# Patient Record
Sex: Female | Born: 1995 | Race: Black or African American | Hispanic: No | Marital: Single | Smoking: Never smoker
Health system: Southern US, Community
[De-identification: ages and names within clinical notes are randomized; demographics above are authoritative.]

## PROBLEM LIST (undated history)

## (undated) DIAGNOSIS — R7989 Other specified abnormal findings of blood chemistry: Secondary | ICD-10-CM

## (undated) DIAGNOSIS — N939 Abnormal uterine and vaginal bleeding, unspecified: Principal | ICD-10-CM

## (undated) DIAGNOSIS — F909 Attention-deficit hyperactivity disorder, unspecified type: Secondary | ICD-10-CM

## (undated) HISTORY — DX: Attention-deficit hyperactivity disorder, unspecified type: F90.9

## (undated) HISTORY — DX: Other specified abnormal findings of blood chemistry: R79.89

## (undated) HISTORY — DX: Abnormal uterine and vaginal bleeding, unspecified: N93.9

## (undated) HISTORY — PX: NO PAST SURGERIES: SHX2092

---
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
## 2015-02-19 ENCOUNTER — Ambulatory Visit: Payer: 59 | Admitting: Women's Health

## 2015-02-19 ENCOUNTER — Encounter: Payer: Self-pay | Admitting: Women's Health

## 2015-05-27 ENCOUNTER — Ambulatory Visit (INDEPENDENT_AMBULATORY_CARE_PROVIDER_SITE_OTHER): Payer: 59 | Admitting: Adult Health

## 2015-05-27 ENCOUNTER — Encounter: Payer: Self-pay | Admitting: Adult Health

## 2015-05-27 VITALS — BP 118/70 | HR 60 | Ht 67.5 in | Wt 158.0 lb

## 2015-05-27 DIAGNOSIS — N939 Abnormal uterine and vaginal bleeding, unspecified: Secondary | ICD-10-CM | POA: Diagnosis not present

## 2015-05-27 DIAGNOSIS — Z3202 Encounter for pregnancy test, result negative: Secondary | ICD-10-CM | POA: Diagnosis not present

## 2015-05-27 DIAGNOSIS — Z113 Encounter for screening for infections with a predominantly sexual mode of transmission: Secondary | ICD-10-CM

## 2015-05-27 HISTORY — DX: Abnormal uterine and vaginal bleeding, unspecified: N93.9

## 2015-05-27 LAB — POCT URINE PREGNANCY: Preg Test, Ur: NEGATIVE

## 2015-05-27 NOTE — Progress Notes (Signed)
Subjective:     Patient ID: Nancy Bradford, female   DOB: 1995-11-02, 19 y.o.   MRN: [id number]  HPI Nancy Bradford is a 19 year old black female in complaining of vaginal bleeding and wants STD screening,she is on lo loestrin but has missed some pills and started bleeding 10/13 after sex,still bleeding.No pain, wonders if "cervix ruptured with sex".  Review of Systems Patient denies any headaches, hearing loss, fatigue, blurred vision, shortness of breath, chest pain, abdominal pain, problems with bowel movements, urination, or intercourse. No joint pain or mood swings.See HPI for positives. Reviewed past medical,surgical, social and family history. Reviewed medications and allergies.     Objective:   Physical Exam BP 118/70 mmHg  Pulse 60  Ht 5' 7.5" (1.715 m)  Wt 158 lb (71.668 kg)  BMI 24.37 kg/m2  LMP 05/09/2015 UPT negative, Skin warm and dry.Pelvic: external genitalia is normal in appearance no lesions, vagina: period like blood,urethra has no lesions or masses noted, cervix:smooth, uterus: normal size, shape and contour, non tender, no masses felt, adnexa: no masses or tenderness noted. Bladder is non tender and no masses felt. GC/CHL obtained.    Explained cervix was fine, that bleeding is most likely from missed OCs.She can't find current pill pack, will give her a sample to day.  Assessment:     Vaginal bleeding STD screening    Plan:     Given 1 pack lo loestrin lot [phone] A exp 6/17,take every day at same time Use condoms Check HIV,RPR and HSV 2, GC/CHL sent   Follow up prn

## 2015-05-27 NOTE — Patient Instructions (Signed)
Take lo loestrin daily at same time Use condom Follow up prn

## 2015-05-28 LAB — GC/CHLAMYDIA PROBE AMP
Chlamydia trachomatis, NAA: NEGATIVE
Neisseria gonorrhoeae by PCR: NEGATIVE

## 2015-05-28 LAB — HSV 2 ANTIBODY, IGG: HSV 2 Glycoprotein G Ab, IgG: <0.91 index (ref 0.00–0.90)

## 2015-05-28 LAB — HIV ANTIBODY (ROUTINE TESTING W REFLEX): HIV Screen 4th Generation wRfx: NONREACTIVE

## 2015-05-28 LAB — RPR: RPR Ser Ql: NONREACTIVE

## 2015-12-28 ENCOUNTER — Other Ambulatory Visit: Payer: Self-pay | Admitting: Women's Health

## 2016-01-20 ENCOUNTER — Ambulatory Visit (INDEPENDENT_AMBULATORY_CARE_PROVIDER_SITE_OTHER): Payer: 59 | Admitting: Obstetrics and Gynecology

## 2016-01-20 ENCOUNTER — Encounter: Payer: Self-pay | Admitting: Obstetrics and Gynecology

## 2016-01-20 VITALS — BP 120/64 | Ht 67.5 in | Wt 164.0 lb

## 2016-01-20 DIAGNOSIS — Z113 Encounter for screening for infections with a predominantly sexual mode of transmission: Secondary | ICD-10-CM

## 2016-01-20 LAB — POCT WET PREP (WET MOUNT): Trichomonas Wet Prep HPF POC: NEGATIVE

## 2016-01-20 NOTE — Progress Notes (Signed)
Patient ID: Nancy Bradford, female   DOB: 1996-03-30, 20 y.o.   MRN: [id number]    [facility]  @[facility] @            Patient name: Nancy Bradford MRN [id number]  Date of birth: 1996-03-30  CC & HPI:  Nancy Bradford is a 20 y.o. female presenting today for STD screening. No symptoms currently. Pt is sexually active. She takes birth control pills and notes she occasionally forgets to take the pill; she also uses condoms intermittently. Pt reports having 1 partner in the past 6 months. LNMP 01/05/2016. Denies vaginal discharge, itching or pain, dysuria, fever, chills, abdominal pain, or flank pain.  ROS:  Review of Systems  Constitutional: Negative for fever.  Genitourinary: Negative for dysuria.       - vaginal itching, D/C, or pain  All other systems reviewed and are negative.  Pertinent History Reviewed:   Reviewed: Significant for none Medical         Past Medical History  Diagnosis Date  . ADHD (attention deficit hyperactivity disorder)     manages without meds  . Vaginal bleeding 05/27/2015                              Surgical Hx:    Past Surgical History  Procedure Laterality Date  . No past surgeries     Medications: Reviewed & Updated - see associated section                       Current outpatient prescriptions:  .  acetaminophen (TYLENOL) 325 MG tablet, Take 650 mg by mouth every 6 (six) hours as needed for pain., Disp: , Rfl:  .  albuterol (PROVENTIL HFA;VENTOLIN HFA) 108 (90 BASE) MCG/ACT inhaler, Inhale 1-2 puffs into the lungs every 4 (four) hours as needed for wheezing or shortness of breath., Disp: 1 Inhaler, Rfl: 2 .  ibuprofen (ADVIL,MOTRIN) 200 MG tablet, Take 400 mg by mouth every 6 (six) hours as needed. Pain., Disp: , Rfl:  .  LO LOESTRIN FE 1 MG-10 MCG / 10 MCG tablet, TAKE 1 TABLET BY MOUTH DAILY, Disp: 28 tablet, Rfl: 11   Social History: Reviewed -  reports that she has never smoked. She has never used smokeless tobacco.  Objective Findings:   Vitals: Blood pressure 120/64, height 5' 7.5" (1.715 m), weight 164 lb (74.39 kg), last menstrual period 01/05/2016.  Physical Examination:  General appearance - alert, well appearing, and in no distress, oriented to person, place, and time and normal appearing weight Pelvic VULVA: normal appearing vulva with no masses, tenderness or lesions,  VAGINA: normal appearing vagina with normal color and discharge, no lesions,  CERVIX: normal appearing cervix without discharge or lesions,    Assessment & Plan:   A:  1. Encounter for STD screening, asymptomatic  2. KOH collected, negative for trichomonas 3. Collected GC/CHL  P:  1. Order HIV and RPR    By signing my name below, I, Marisue HumbleMichelle Chaffee, attest that this documentation has been prepared under the direction and in the presence of Tilda BurrowJohn V Avis Tirone, MD . Electronically Signed: Marisue HumbleMichelle Chaffee, Scribe. 01/20/2016. 3:43 PM.  I personally performed the services described in this documentation, which was SCRIBED in my presence. The recorded information has been reviewed and considered accurate. It has been edited as necessary during review. Tilda BurrowFERGUSON,Danise Dehne V, MD  Examination with Resident.

## 2016-01-21 ENCOUNTER — Telehealth: Payer: Self-pay | Admitting: *Deleted

## 2016-01-21 LAB — GC/CHLAMYDIA PROBE AMP
Chlamydia trachomatis, NAA: NEGATIVE
Neisseria gonorrhoeae by PCR: NEGATIVE

## 2016-01-21 LAB — HIV ANTIBODY (ROUTINE TESTING W REFLEX): HIV SCREEN 4TH GENERATION: NONREACTIVE

## 2016-01-21 LAB — RPR: RPR Ser Ql: NONREACTIVE

## 2016-01-22 NOTE — Telephone Encounter (Signed)
Pt informed HIV, RPR, GC/CHL from 01/20/2016 all negative per Dr. Emelda FearFerguson.

## 2016-04-06 ENCOUNTER — Ambulatory Visit: Payer: 59 | Admitting: Obstetrics and Gynecology

## 2016-04-10 ENCOUNTER — Ambulatory Visit (INDEPENDENT_AMBULATORY_CARE_PROVIDER_SITE_OTHER): Payer: 59 | Admitting: Obstetrics and Gynecology

## 2016-04-10 ENCOUNTER — Encounter: Payer: Self-pay | Admitting: Obstetrics and Gynecology

## 2016-04-10 VITALS — BP 110/60 | HR 64 | Ht 67.5 in | Wt 158.0 lb

## 2016-04-10 DIAGNOSIS — Z113 Encounter for screening for infections with a predominantly sexual mode of transmission: Secondary | ICD-10-CM

## 2016-04-10 NOTE — Progress Notes (Signed)
[facility]  04/10/16           Patient name: Nancy Bradford MRN [id number]  Date of birth: [birth year]  CC & HPI:  Nancy Bradford is a 20 y.o. female presenting today for breakthrough bleeding and  change in birth control methods. Patient was taking oral contraceptive pills which she stopped taking 1 month ago. She reports using Depo in the past but would like other options. She states this is for birth control purposes and not period control. She denies any other complaints at this time.  ROS:  ROS  +breakthrough bleeding  No physical complaints at this time  Pertinent History Reviewed:   Reviewed: Significant for  Medical         Past Medical History:  Diagnosis Date  . ADHD (attention deficit hyperactivity disorder)    manages without meds  . Vaginal bleeding 05/27/2015                              Surgical Hx:    Past Surgical History:  Procedure Laterality Date  . NO PAST SURGERIES     Medications: Reviewed & Updated - see associated section                       Current Outpatient Prescriptions:  .  acetaminophen (TYLENOL) 325 MG tablet, Take 650 mg by mouth every 6 (six) hours as needed for pain., Disp: , Rfl:  .  albuterol (PROVENTIL HFA;VENTOLIN HFA) 108 (90 BASE) MCG/ACT inhaler, Inhale 1-2 puffs into the lungs every 4 (four) hours as needed for wheezing or shortness of breath., Disp: 1 Inhaler, Rfl: 2 .  ibuprofen (ADVIL,MOTRIN) 200 MG tablet, Take 400 mg by mouth every 6 (six) hours as needed. Pain., Disp: , Rfl:    Social History: Reviewed -  reports that she has never smoked. She has never used smokeless tobacco.  Objective Findings:  Vitals: Blood pressure 110/60, pulse 64, height 5' 7.5" (1.715 m), weight 158 lb (71.7 kg), last menstrual period 03/14/2016.  Physical Examination: General appearance - alert, well appearing, and in no distress and oriented to person, place, and time Mental status - alert, oriented to person, place, and time, normal  mood, behavior, speech, dress, motor activity, and thought processes Pelvic -  VULVA: normal appearing vulva with no masses, tenderness or lesions,  VAGINA: normal appearing vagina with normal color and discharge, no lesions,  CERVIX: normal appearing cervix without discharge or lesions,  UTERUS: uterus is normal size, shape, consistency and nontender,  ADNEXA: normal adnexa in size, nontender and no masses  Wet Prep: negative, epithelial cells only   Answered numerous questions regarding different birth control methods and likelihood of different gyn conditions. Dispelled concerns that birth control causes sterilization and stressed importance of STD testing between different sexual partners.   The provider spent over 30 minutes with the visit, including previsit review, and documentation, with > than 50% spent in counseling and coordination of care.   Assessment & Plan:   A:  1. Breakthrough bleeding  2. STI screen, follow up GC CHL  P:  1. Higher dose contraceptive pill, will start on Sprintec 2. Follow up in 1 year     By signing my name below, I, Sonum Patel, attest that this documentation has been prepared under the direction and in the presence of Tilda BurrowJohn V Daneli Butkiewicz, MD. Electronically Signed: Sonum Patel, Neurosurgeoncribe. 04/10/16.  9:39 AM.  I personally performed the services described in this documentation, which was SCRIBED in my presence. The recorded information has been reviewed and considered accurate. It has been edited as necessary during review. Tilda Burrow, MD

## 2016-04-13 LAB — GC/CHLAMYDIA PROBE AMP
Chlamydia trachomatis, NAA: NEGATIVE
NEISSERIA GONORRHOEAE BY PCR: NEGATIVE

## 2016-07-30 ENCOUNTER — Ambulatory Visit (INDEPENDENT_AMBULATORY_CARE_PROVIDER_SITE_OTHER): Payer: 59 | Admitting: Obstetrics and Gynecology

## 2016-07-30 ENCOUNTER — Encounter: Payer: Self-pay | Admitting: Obstetrics and Gynecology

## 2016-07-30 ENCOUNTER — Other Ambulatory Visit: Payer: Self-pay | Admitting: Obstetrics and Gynecology

## 2016-07-30 VITALS — BP 110/80 | HR 80 | Ht 67.0 in | Wt 154.0 lb

## 2016-07-30 DIAGNOSIS — A549 Gonococcal infection, unspecified: Secondary | ICD-10-CM | POA: Diagnosis not present

## 2016-07-30 MED ORDER — CEFTRIAXONE SODIUM 250 MG IJ SOLR
250.0000 mg | Freq: Once | INTRAMUSCULAR | Status: AC
  Administered 2016-07-30: 250 mg via INTRAMUSCULAR

## 2016-07-30 MED ORDER — DOXYCYCLINE HYCLATE 100 MG PO CAPS: 100.0000 mg | ORAL_CAPSULE | Freq: Two times a day (BID) | ORAL | 0 refills | Status: DC

## 2016-07-30 NOTE — Progress Notes (Signed)
Rocephin 250mg  IM given in right ventrogluteal with no complications.

## 2016-07-30 NOTE — Progress Notes (Signed)
Patient ID: Nancy Cellarliyah Savino, female   DOB: [birth year], 20 y.o.   MRN: [id number]   [facility]  @[facility] @            Patient name: Nancy Bradford MRN [id number]  Date of birth: [birth year]  CC & HPI:   Chief Complaint  Patient presents with  . need tx    GC     Nancy Cellarliyah Lassalle is a 20 y.o. female presenting today for tx of GC. [facility]. Pt states she is asymptomatic. She notes the infection likely occurred 3 months ago and she does not currently have a sexual partner. She denies fever, vaginal discharge or bleeding, abdominal pain.   ROS:  ROS No complaints.   Pertinent History Reviewed:   Reviewed Medical         Past Medical History:  Diagnosis Date  . ADHD (attention deficit hyperactivity disorder)    manages without meds  . Vaginal bleeding 05/27/2015                              Surgical Hx:    Past Surgical History:  Procedure Laterality Date  . NO PAST SURGERIES     Medications: Reviewed & Updated - see associated section                      No current outpatient prescriptions on file.   Social History: Reviewed -  reports that she has never smoked. She has never used smokeless tobacco.  Objective Findings:  Vitals: Blood pressure 110/80, pulse 80, height 5\' 7"  (1.702 m), weight 154 lb (69.9 kg), last menstrual period 07/19/2016.  Physical Examination: Discussion only    Assessment & Plan:   A:  1. [facility]   P:  1. Pt administered 250 mg IM Rocephin office, will rx 100 mg bid doxycyline x 7d  2. POC in 6 weeks at GYN f/u   By signing my name below, I, Doreatha MartinEva Mathews, attest that this documentation has been prepared under the direction and in the presence of Tilda BurrowJohn V Kindrick Lankford, MD. Electronically Signed: Doreatha MartinEva Mathews, ED Scribe. 07/30/16. 3:15 PM.  I personally performed the services described in this documentation, which was SCRIBED in my presence. The  recorded information has been reviewed and considered accurate. It has been edited as necessary during review. Tilda BurrowFERGUSON,Siren Porrata V, MD

## 2016-07-31 ENCOUNTER — Telehealth: Payer: Self-pay | Admitting: Obstetrics and Gynecology

## 2016-07-31 MED ORDER — DOXYCYCLINE HYCLATE 100 MG PO CAPS: 100.0000 mg | ORAL_CAPSULE | Freq: Two times a day (BID) | ORAL | 0 refills | Status: DC

## 2016-07-31 NOTE — Telephone Encounter (Signed)
LM on pts VM [facility]

## 2016-07-31 NOTE — Addendum Note (Signed)
Addended by: Tilda BurrowFERGUSON, Morine Kohlman V. on: 07/31/2016 10:18 AM   Modules accepted: Orders

## 2016-07-31 NOTE — Telephone Encounter (Signed)
Pt states that she has called [facility] to check on her medication that was called in, they are telling her they have not received it. Pt would like for the nurse to call [facility] and place the order. Pt states she needs this medication really bad. Pt states that she is going into work right now but if we could leave her a message on her phone when the medication as been called in.

## 2016-07-31 NOTE — Telephone Encounter (Signed)
Pt called stating that she came into the office yesterday and was suppose to have had a medication called into the [facility] [address] [place] and they never received it. Please contact pt

## 2016-07-31 NOTE — Telephone Encounter (Signed)
Doxycycline e scribed.

## 2016-10-01 ENCOUNTER — Ambulatory Visit: Payer: 59 | Admitting: Obstetrics and Gynecology

## 2016-10-07 ENCOUNTER — Encounter: Payer: Self-pay | Admitting: Obstetrics and Gynecology

## 2016-10-07 ENCOUNTER — Ambulatory Visit (INDEPENDENT_AMBULATORY_CARE_PROVIDER_SITE_OTHER): Payer: 59 | Admitting: Obstetrics and Gynecology

## 2016-10-07 VITALS — BP 102/62 | HR 54 | Wt 157.4 lb

## 2016-10-07 DIAGNOSIS — Z118 Encounter for screening for other infectious and parasitic diseases: Secondary | ICD-10-CM

## 2016-10-07 DIAGNOSIS — Z1159 Encounter for screening for other viral diseases: Secondary | ICD-10-CM

## 2016-10-07 DIAGNOSIS — A549 Gonococcal infection, unspecified: Secondary | ICD-10-CM | POA: Diagnosis not present

## 2016-10-07 NOTE — Progress Notes (Signed)
   [facility]  10/07/2016           Patient name: Nancy Bradford MRN [id number]  Date of birth: December 04, 1995  CC & HPI:  Nancy Bradford is a 21 y.o. female presenting today for proof of cure following treatment of gonorrhea. Pt has no acute complaints or symptoms at this time.   ROS:  ROS Otherwise negative for acute change except as noted in the HPI. Pt NEVER informed partner of her STI as the relationship was brief and months3 ago. Pt resistant to notifying him  Pertinent History Reviewed:   Reviewed: Medical         Past Medical History:  Diagnosis Date  . ADHD (attention deficit hyperactivity disorder)    manages without meds  . Low vitamin D level   . Vaginal bleeding 05/27/2015                              Surgical Hx:    Past Surgical History:  Procedure Laterality Date  . NO PAST SURGERIES     Medications: Reviewed & Updated - see associated section                       Current Outpatient Prescriptions:  .  cholecalciferol (VITAMIN D) 1000 units tablet, Take 1,000 Units by mouth once a week., Disp: , Rfl:    Social History: Reviewed -  reports that she has never smoked. She has never used smokeless tobacco.  Objective Findings:  Vitals: Blood pressure 102/62, pulse (!) 54, weight 157 lb 6.4 oz (71.4 kg), last menstrual period 09/05/2016.  Physical Examination: General appearance - alert, well appearing, and in no distress Mental status - alert, oriented to person, place, and time Pelvic -  VULVA: normal appearing vulva with no masses, tenderness or lesions,  VAGINA: normal appearing vagina with normal color and discharge, no lesions,  CERVIX: normal appearing cervix without discharge or lesions,  UTERUS: uterus is normal size, shape, consistency and nontender,  ADNEXA: normal adnexa in size, nontender and no masses,  WET MOUNT done - results: KOH done, DNA probe for chlamydia and GC obtained   Assessment & Plan:   A:  1. h/o GC  P:  1. Proof  of cure testing     By signing my name below, I, Freida Busmaniana Omoyeni, attest that this documentation has been prepared under the direction and in the presence of Tilda BurrowJohn V Tyrone Balash, MD . Electronically Signed: Freida Busmaniana Omoyeni, Scribe. 10/07/2016. 4:10 PM. I personally performed the services described in this documentation, which was SCRIBED in my presence. The recorded information has been reviewed and considered accurate. It has been edited as necessary during review. Tilda BurrowFERGUSON,Klarisa Barman V, MD

## 2016-10-09 LAB — GC/CHLAMYDIA PROBE AMP
CHLAMYDIA, DNA PROBE: NEGATIVE
NEISSERIA GONORRHOEAE BY PCR: NEGATIVE

## 2017-02-02 ENCOUNTER — Telehealth: Payer: Self-pay | Admitting: Pediatrics

## 2017-02-02 NOTE — Telephone Encounter (Signed)
done

## 2017-02-02 NOTE — Telephone Encounter (Signed)
Patient under Nancy Bradford needs shot record printed out for school

## 2017-08-20 NOTE — Telephone Encounter (Signed)
error 

## 2017-11-14 ENCOUNTER — Other Ambulatory Visit: Payer: Self-pay

## 2017-11-14 ENCOUNTER — Emergency Department (HOSPITAL_COMMUNITY)
Admission: EM | Admit: 2017-11-14 | Discharge: 2017-11-14 | Disposition: A | Discharge status: Home / Self Care | Payer: 59 | Attending: Emergency Medicine | Admitting: Emergency Medicine

## 2017-11-14 ENCOUNTER — Encounter (HOSPITAL_COMMUNITY): Payer: Self-pay | Admitting: Emergency Medicine

## 2017-11-14 DIAGNOSIS — F1290 Cannabis use, unspecified, uncomplicated: Secondary | ICD-10-CM | POA: Diagnosis not present

## 2017-11-14 DIAGNOSIS — Z79899 Other long term (current) drug therapy: Secondary | ICD-10-CM | POA: Diagnosis not present

## 2017-11-14 DIAGNOSIS — Z0489 Encounter for examination and observation for other specified reasons: Secondary | ICD-10-CM | POA: Diagnosis present

## 2017-11-14 LAB — RAPID URINE DRUG SCREEN, HOSP PERFORMED
Amphetamines: NOT DETECTED
Barbiturates: NOT DETECTED
Benzodiazepines: NOT DETECTED
COCAINE: NOT DETECTED
OPIATES: NOT DETECTED
Tetrahydrocannabinol: POSITIVE — AB

## 2017-11-14 LAB — PREGNANCY, URINE: Preg Test, Ur: NEGATIVE

## 2017-11-14 NOTE — ED Notes (Signed)
Pt refused for staff to call sane nurse, States "I only want a drug test"
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
